# Patient Record
Sex: Male | Born: 1990 | Race: White | Hispanic: No | Marital: Married | Smoking: Former smoker
Health system: Southern US, Community
[De-identification: ages and names within clinical notes are randomized; demographics above are authoritative.]

---
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
## 2009-06-10 ENCOUNTER — Emergency Department: Payer: Self-pay | Admitting: Emergency Medicine

## 2010-11-27 IMAGING — CT CT ABD-PELV W/O CM
1 of 2 series · 15 of 32 positions shown, 19 images · non-contrast
Comparison: none

REASON FOR EXAM: (1) Periumbilical pain and vomiting; (2) Periumbilical
pain and vomiting
COMMENTS:

[Series 2: 3mm soft tissue · axial · 0.60mm/px · z∈[-489,-75]mm · 15 of 152 slices shown, 19 images]
[im 7/152  soft-tissue]
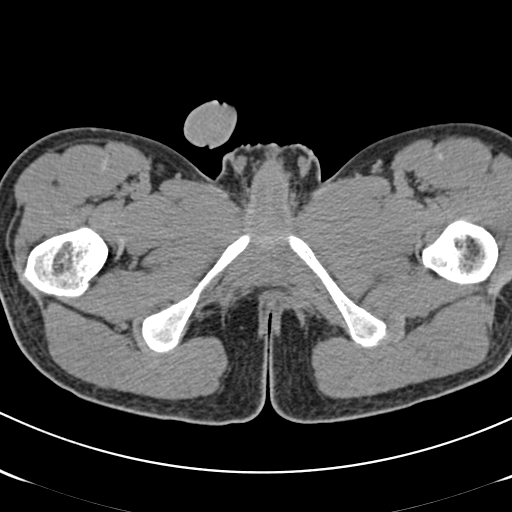
[im 7/152  bone]
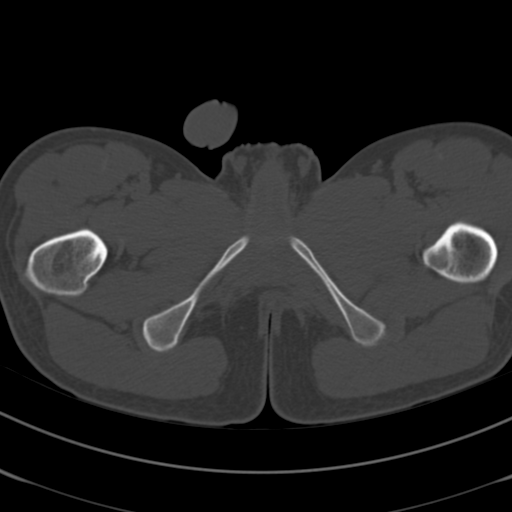
[im 19/152  soft-tissue]
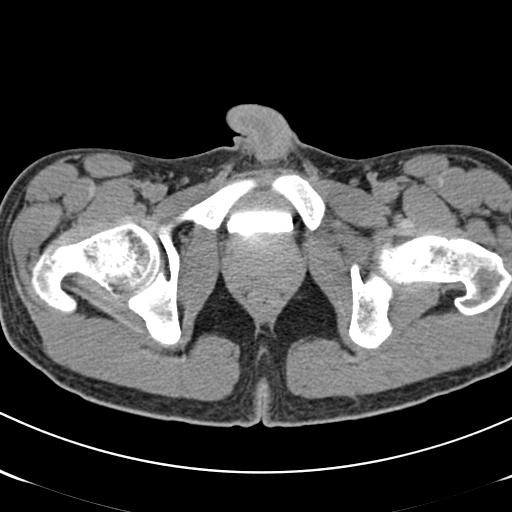
[im 32/152  soft-tissue]
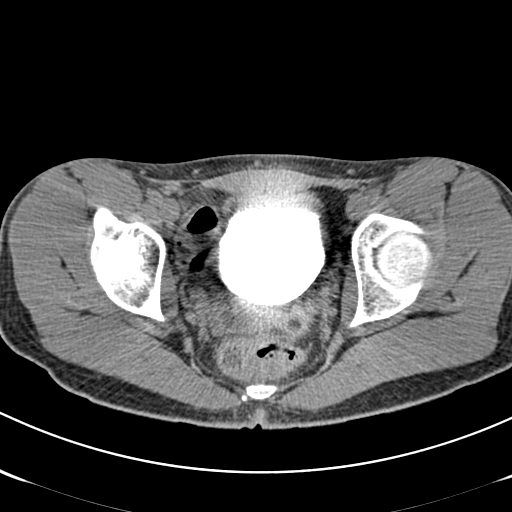
[im 45/152  soft-tissue]
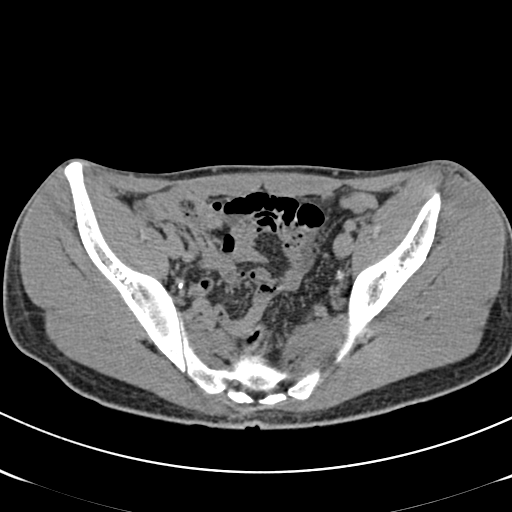
[im 51/152  soft-tissue]
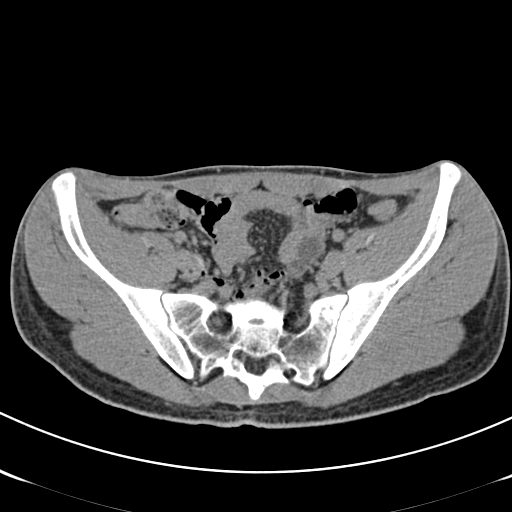
[im 63/152  soft-tissue]
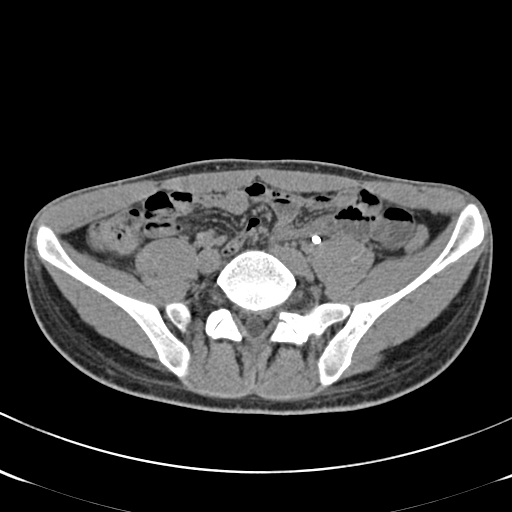
[im 76/152  soft-tissue]
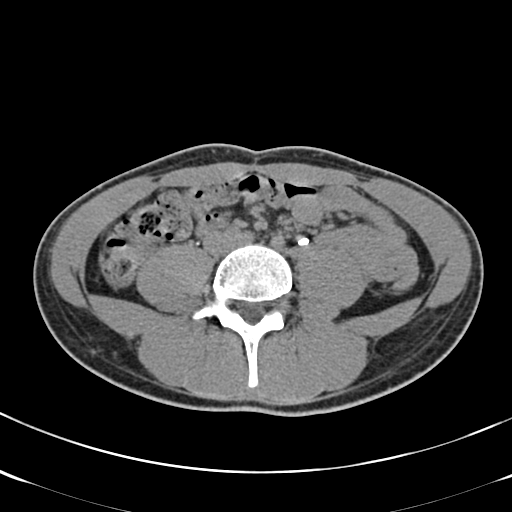
[im 89/152  soft-tissue]
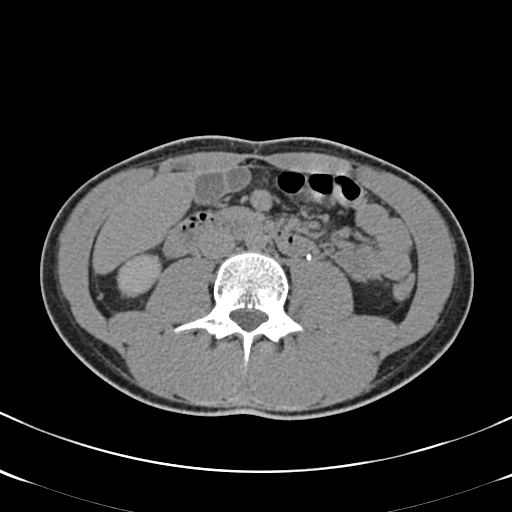
[im 101/152  soft-tissue]
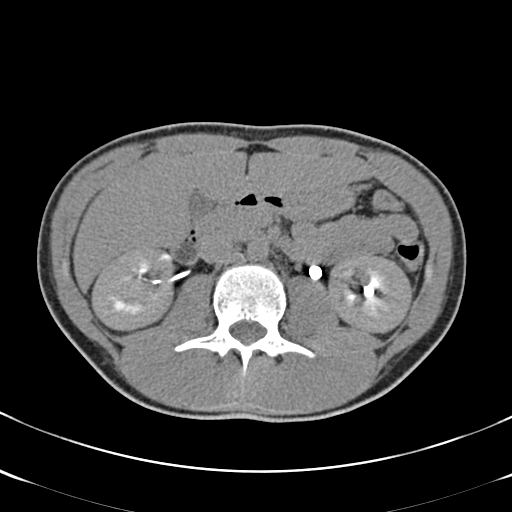
[im 101/152  bone]
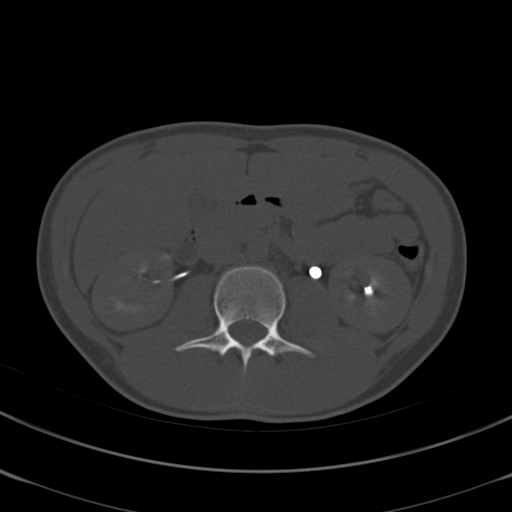
[im 107/152  soft-tissue]
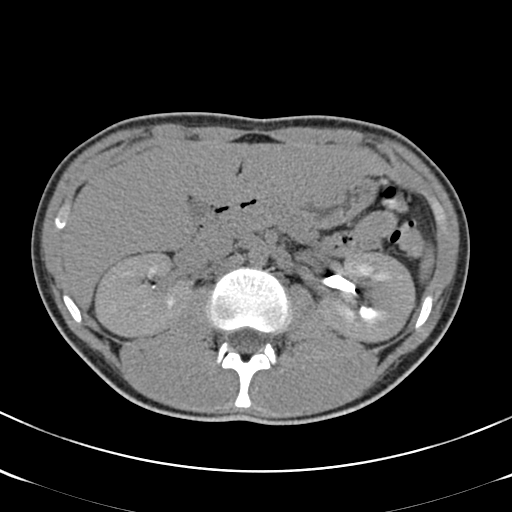
[im 120/152  soft-tissue]
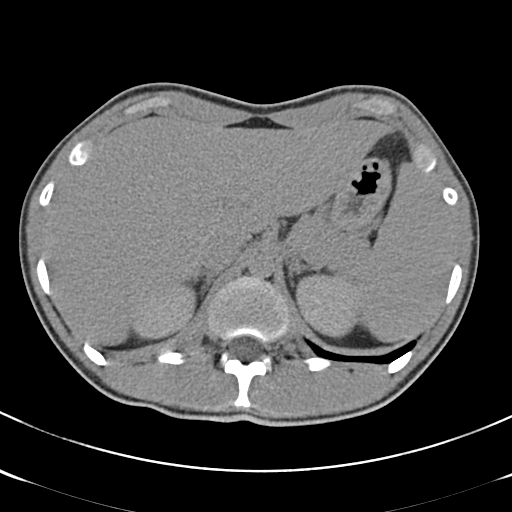
[im 126/152  lung]
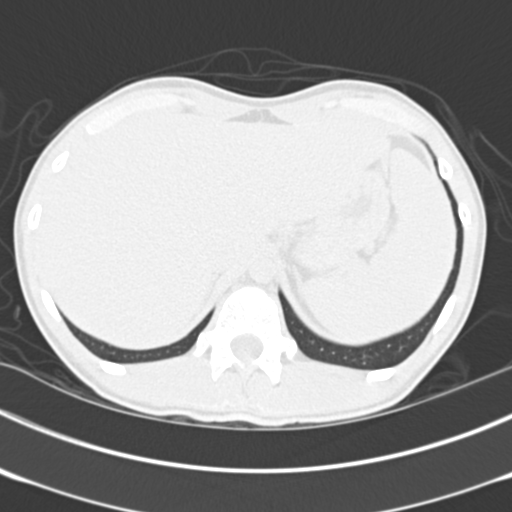
[im 133/152  soft-tissue]
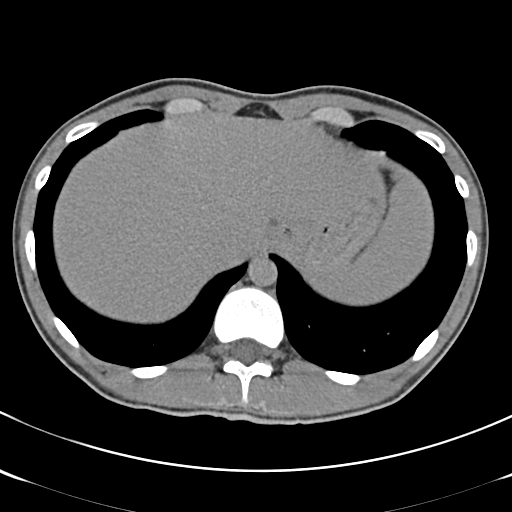
[im 133/152  lung]
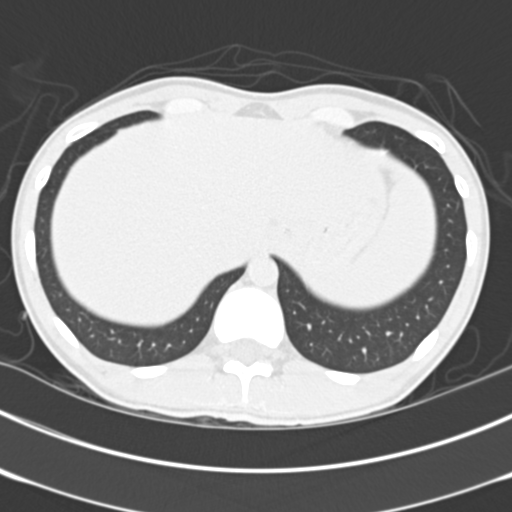
[im 139/152  lung]
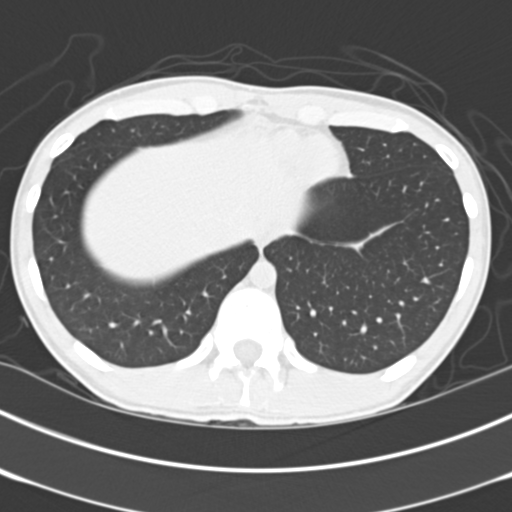
[im 145/152  soft-tissue]
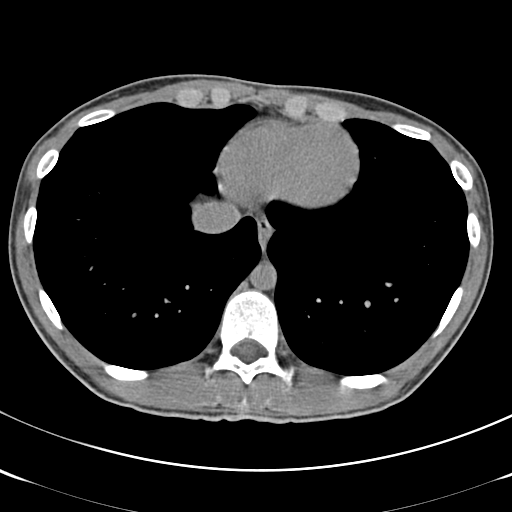
[im 145/152  lung]
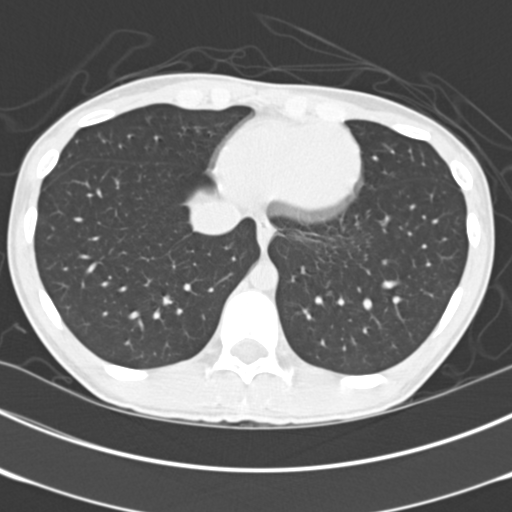

[15 of 32 positions shown; findings below may reference images not displayed]

PROCEDURE:     CT  - CT ABDOMEN AND PELVIS W[DATE]  [DATE]

RESULT:     Axial noncontrast CT scanning was performed through the abdomen
and pelvis at 3 mm intervals and slice thicknesses. Review of multiplanar
reconstructed images was performed separately on the VIA monitor. The study
was performed approximately 45 minutes after a neck CT that had been
performed with contrast.

The liver exhibits no focal mass or ductal dilation. The spleen is
borderline enlarged at 12 cm AP. The gallbladder, pancreas, adrenal glands,
and kidneys appear normal. There is fractional visualization of the ureters.
The partially distended urinary bladder is normal in appearance. The
unopacified loops of small and large bowel exhibit no acute abnormality. The
caliber of the abdominal aorta is normal. There is no evidence of ascites.
The lumbar vertebral bodies are preserved in height. The lung bases are
clear.
IMPRESSION: 1. There is borderline splenomegaly.
2. I do not see bulky intra-abdominal or pelvic lymphadenopathy.
3. I see no acute hepatobiliary abnormality nor acute bowel abnormality nor
acute urinary tract abnormality.

A preliminary report was sent to the [HOSPITAL] the conclusion
of the study.

## 2016-11-10 ENCOUNTER — Ambulatory Visit (INDEPENDENT_AMBULATORY_CARE_PROVIDER_SITE_OTHER): Payer: BLUE CROSS/BLUE SHIELD

## 2016-11-10 ENCOUNTER — Ambulatory Visit
Admission: EM | Admit: 2016-11-10 | Discharge: 2016-11-10 | Disposition: A | Discharge status: Home / Self Care | Payer: BLUE CROSS/BLUE SHIELD | Attending: Emergency Medicine | Admitting: Emergency Medicine

## 2016-11-10 ENCOUNTER — Other Ambulatory Visit: Payer: Self-pay

## 2016-11-10 DIAGNOSIS — R0789 Other chest pain: Secondary | ICD-10-CM | POA: Diagnosis not present

## 2016-11-10 DIAGNOSIS — R0602 Shortness of breath: Secondary | ICD-10-CM | POA: Diagnosis not present

## 2016-11-10 DIAGNOSIS — R059 Cough, unspecified: Secondary | ICD-10-CM | POA: Diagnosis present

## 2016-11-10 DIAGNOSIS — R05 Cough: Secondary | ICD-10-CM | POA: Diagnosis not present

## 2016-11-10 DIAGNOSIS — J181 Lobar pneumonia, unspecified organism: Secondary | ICD-10-CM | POA: Diagnosis not present

## 2016-11-10 DIAGNOSIS — R030 Elevated blood-pressure reading, without diagnosis of hypertension: Secondary | ICD-10-CM

## 2016-11-10 DIAGNOSIS — J189 Pneumonia, unspecified organism: Secondary | ICD-10-CM

## 2016-11-10 MED ORDER — BENZONATATE 100 MG PO CAPS: 100.0000 mg | ORAL_CAPSULE | Freq: Three times a day (TID) | ORAL | 0 refills | Status: AC

## 2016-11-10 MED ORDER — IBUPROFEN 800 MG PO TABS
800.0000 mg | ORAL_TABLET | Freq: Once | ORAL | Status: AC
  Administered 2016-11-10: 800 mg via ORAL

## 2016-11-10 MED ORDER — ALBUTEROL SULFATE HFA 108 (90 BASE) MCG/ACT IN AERS: 2.0000 | INHALATION_SPRAY | RESPIRATORY_TRACT | 0 refills | Status: AC | PRN

## 2016-11-10 MED ORDER — AZITHROMYCIN 250 MG PO TABS
250.0000 mg | ORAL_TABLET | Freq: Every day | ORAL | 0 refills | Status: AC
Start: 2016-11-10 — End: ?

## 2016-11-10 NOTE — ED Triage Notes (Signed)
Patient complains of cough and congestion x 2 weeks. Patient states that he has been taking mucinex for 2 weeks. Patient states that he is having left rib pain that started about 12am this morning after a coughing episode.

## 2016-11-10 NOTE — Discharge Instructions (Addendum)
Your chest xray shows you have early pneumonia, do not wear rib belt. Rest, push fluids, take meds as directed. May alternate tylenol/ibuprofen as label directed. Go to Er for new or worsening s/s(SOB, worsening pain, unable to keep meds down etc). Your Bp was elevated in office, please get it rechecked in 2-3 days. Return to UC as needed.

## 2016-11-10 NOTE — ED Provider Notes (Signed)
[facility]    CSN: [id number] Arrival date & time: 11/10/16  1903     History   Chief Complaint Chief Complaint  Patient presents with  . Cough    HPI James Garrett is a 26 y.o. male.   The history is provided by the patient. No language interpreter was used.  Cough  Cough characteristics:  Non-productive Severity:  Mild Onset quality:  Gradual Duration:  2 weeks Timing:  Constant Progression:  Unchanged Chronicity:  New Smoker: no   Context: upper respiratory infection   Relieved by:  Nothing Worsened by:  Deep breathing and activity Ineffective treatments:  Rest (back brace) Associated symptoms: myalgias and sinus congestion   Associated symptoms: no chest pain, no chills, no diaphoresis, no ear fullness, no ear pain, no eye discharge, no fever, no headaches, no rash, no rhinorrhea, no shortness of breath, no sore throat, no weight loss and no wheezing   Associated symptoms comment:  Left chest wall pain after coughing recently   History reviewed. No pertinent past medical history.  Patient Active Problem List   Diagnosis Date Noted  . Left-sided chest wall pain 11/10/2016  . Cough 11/10/2016  . Pneumonia of both lower lobes due to infectious organism (HCC) 11/10/2016    History reviewed. No pertinent surgical history.     Home Medications    Prior to Admission medications   Medication Sig Start Date End Date Taking? Authorizing Provider  albuterol (PROVENTIL HFA;VENTOLIN HFA) 108 (90 Base) MCG/ACT inhaler Inhale 2 puffs every 4 (four) hours as needed into the lungs for wheezing or shortness of breath. 11/10/16   Jimmey Hengel, Para MarchJeanette, NP  azithromycin (ZITHROMAX) 250 MG tablet Take 1 tablet (250 mg total) daily by mouth. Take first 2 tablets together, then 1 every day until finished. 11/10/16   Horrace Hanak, Para MarchJeanette, NP  benzonatate (TESSALON) 100 MG capsule Take 1 capsule (100 mg total) every 8 (eight) hours by mouth. 11/10/16   Zhyon Antenucci,  Para MarchJeanette, NP    Family History History reviewed. No pertinent family history.  Social History Social History   Tobacco Use  . Smoking status: Former Games developermoker  . Smokeless tobacco: Never Used  Substance Use Topics  . Alcohol use: Yes    Comment: occasionally  . Drug use: No     Allergies   Patient has no known allergies.   Review of Systems Review of Systems  Constitutional: Negative for chills, diaphoresis, fever and weight loss.  HENT: Positive for congestion. Negative for ear pain, rhinorrhea and sore throat.   Eyes: Negative for pain, discharge and visual disturbance.  Respiratory: Positive for cough. Negative for shortness of breath and wheezing.   Cardiovascular: Negative for chest pain and palpitations.  Gastrointestinal: Negative for abdominal pain and vomiting.  Genitourinary: Negative for dysuria and hematuria.  Musculoskeletal: Positive for myalgias. Negative for arthralgias and back pain.  Skin: Negative for color change and rash.  Neurological: Negative for seizures, syncope and headaches.  All other systems reviewed and are negative.    Physical Exam Triage Vital Signs ED Triage Vitals  Enc Vitals Group     BP 11/10/16 1916 (!) 133/96     Pulse Rate 11/10/16 1916 77     Resp 11/10/16 1916 18     Temp 11/10/16 1916 98.2 F (36.8 C)     Temp Source 11/10/16 1916 Oral     SpO2 11/10/16 1916 100 %     Weight 11/10/16 1914 150 lb (68 kg)  Height 11/10/16 1914 5\' 10"  (1.778 m)     Head Circumference --      Peak Flow --      Pain Score 11/10/16 1916 8     Pain Loc --      Pain Edu? --      Excl. in GC? --    No data found.  Updated Vital Signs BP (!) 133/96 (BP Location: Right Arm)   Pulse 77   Temp 98.2 F (36.8 C) (Oral)   Resp 18   Ht 5\' 10"  (1.778 m)   Wt 150 lb (68 kg)   SpO2 100%   BMI 21.52 kg/m    Physical Exam  Constitutional: He is oriented to person, place, and time. He appears well-developed and well-nourished. He is  active and cooperative.  Non-toxic appearance. He does not have a sickly appearance. He does not appear ill. No distress.  HENT:  Head: Normocephalic.  Right Ear: Tympanic membrane is retracted.  Left Ear: Tympanic membrane is retracted.  Nose: Mucosal edema present.  Mouth/Throat: Uvula is midline, oropharynx is clear and moist and mucous membranes are normal.  Eyes: Conjunctivae, EOM and lids are normal. Pupils are equal, round, and reactive to light.  Neck: Normal range of motion.  Cardiovascular: Normal rate, regular rhythm and normal heart sounds.  Pulmonary/Chest: Effort normal and breath sounds normal. No respiratory distress. He has no decreased breath sounds. He has no wheezes.  Abdominal: Soft. He exhibits no distension.  Musculoskeletal: Normal range of motion.  Neurological: He is alert and oriented to person, place, and time. No cranial nerve deficit or sensory deficit. GCS eye subscore is 4. GCS verbal subscore is 5. GCS motor subscore is 6.  Skin: Skin is warm, dry and intact. No rash noted.  Psychiatric: He has a normal mood and affect. His speech is normal and behavior is normal.  Nursing note and vitals reviewed.    UC Treatments / Results  Labs (all labs ordered are listed, but only abnormal results are displayed) Labs Reviewed - No data to display  EKG  EKG Interpretation None       Radiology Dg Chest 2 View  Result Date: 11/10/2016 CLINICAL DATA:  Productive cough and shortness of breath for 2 weeks. EXAM: CHEST  2 VIEW COMPARISON:  None. FINDINGS: The cardiac silhouette, mediastinal and hilar contours are normal. The lungs demonstrate normal aeration. Patchy E nodular basilar opacities could be small infiltrates. No pleural effusion or pulmonary edema. The bony thorax is normal. IMPRESSION: Small patchy nodular opacities at both lung bases, possible small infiltrates. Electronically Signed   By: Rudie MeyerP.  Gallerani M.D.   On: 11/10/2016 19:48     Procedures Procedures (including critical care time)  Medications Ordered in UC Medications  ibuprofen (ADVIL,MOTRIN) tablet 800 mg (800 mg Oral Given 11/10/16 1923)     Initial Impression / Assessment and Plan / UC Course  I have reviewed the triage vital signs and the nursing notes.  Pertinent labs & imaging results that were available during my care of the patient were reviewed by me and considered in my medical decision making (see chart for details).     Pt received ibuprofen 800 mg po in office, CXR pending. Awaiting results.    2006: Pt improved reviewed results and plan of care. Your chest xray shows you have early pneumonia, do not wear rib belt. Rest, push fluids, take meds as directed. May alternate tylenol/ibuprofen as label directed. Go to Er for new or  worsening s/s(SOB, worsening pain, unable to keep meds down etc). Your BP was elevated in office, please get it rechecked in 2-3 days. Return to UC as needed. Pt verbalized understanding to this provider.  Final Clinical Impressions(s) / UC Diagnoses   Final diagnoses:  Left-sided chest wall pain  Cough  Pneumonia of both lower lobes due to infectious organism [facility])  Elevated blood pressure reading    ED Discharge Orders        Ordered    azithromycin (ZITHROMAX) 250 MG tablet  Daily     11/10/16 2002    benzonatate (TESSALON) 100 MG capsule  Every 8 hours     11/10/16 2002    albuterol (PROVENTIL HFA;VENTOLIN HFA) 108 (90 Base) MCG/ACT inhaler  Every 4 hours PRN     11/10/16 2002       Controlled Substance Prescriptions    Jeena Arnett, Para March, NP 11/10/16 2023

## 2018-04-28 IMAGING — CR DG CHEST 2V
2 series · 3 of 3 positions shown · non-contrast
Comparison: None.

CLINICAL DATA: Productive cough and shortness of breath for 2
weeks.

EXAM:
CHEST  2 VIEW

[Series 1: chest pa · 0.14mm/px · 2 of 2 slices shown]
[im 1/2]
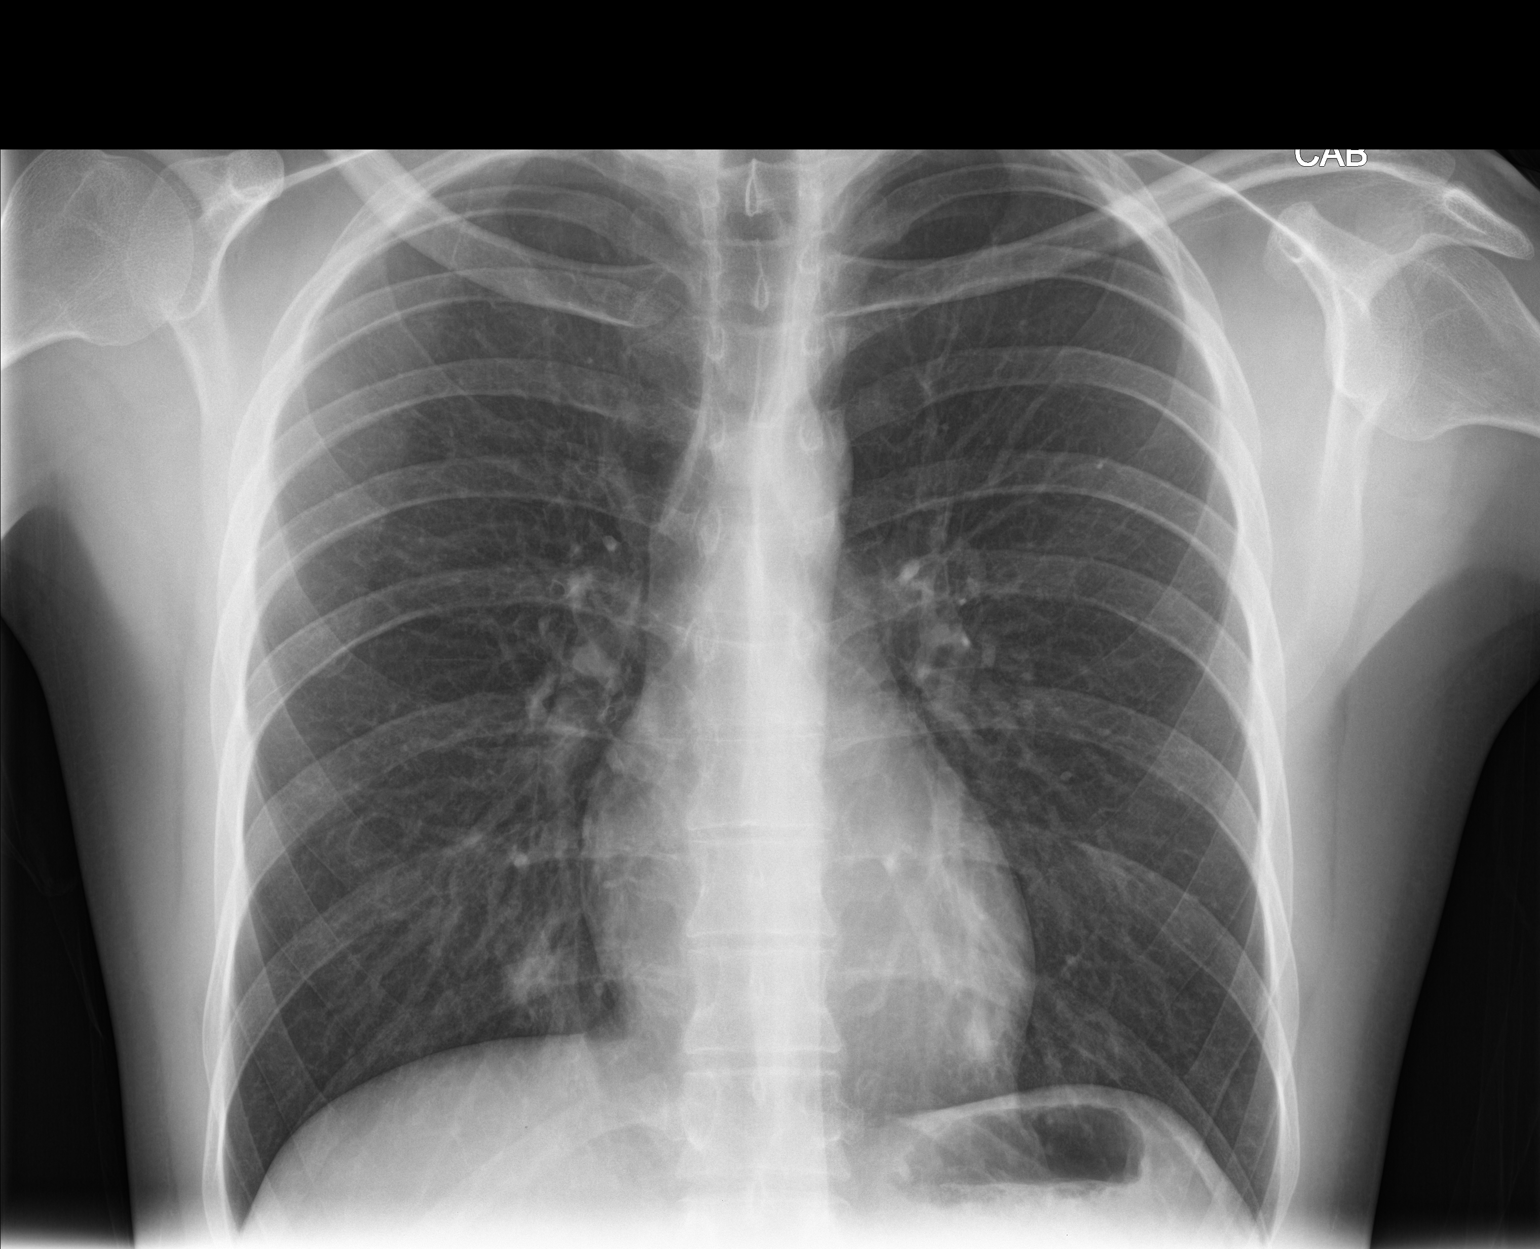
[im 2/2]
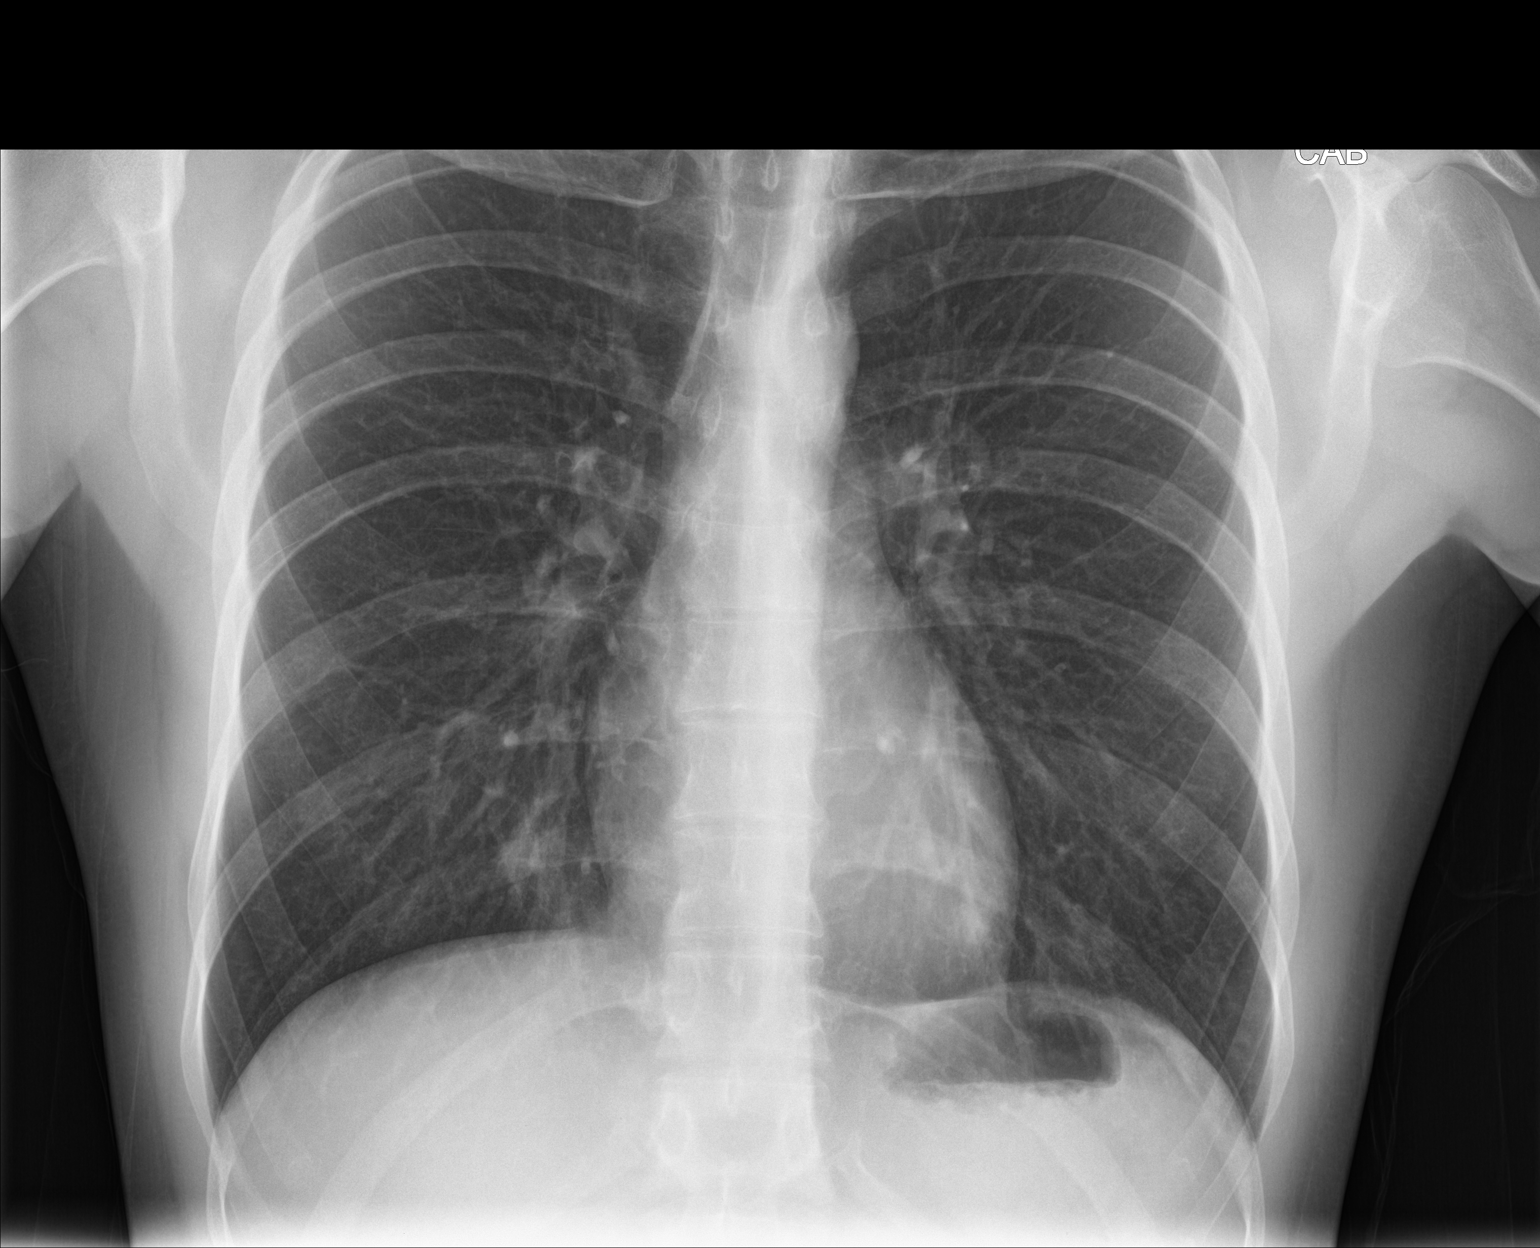

[chest lat]
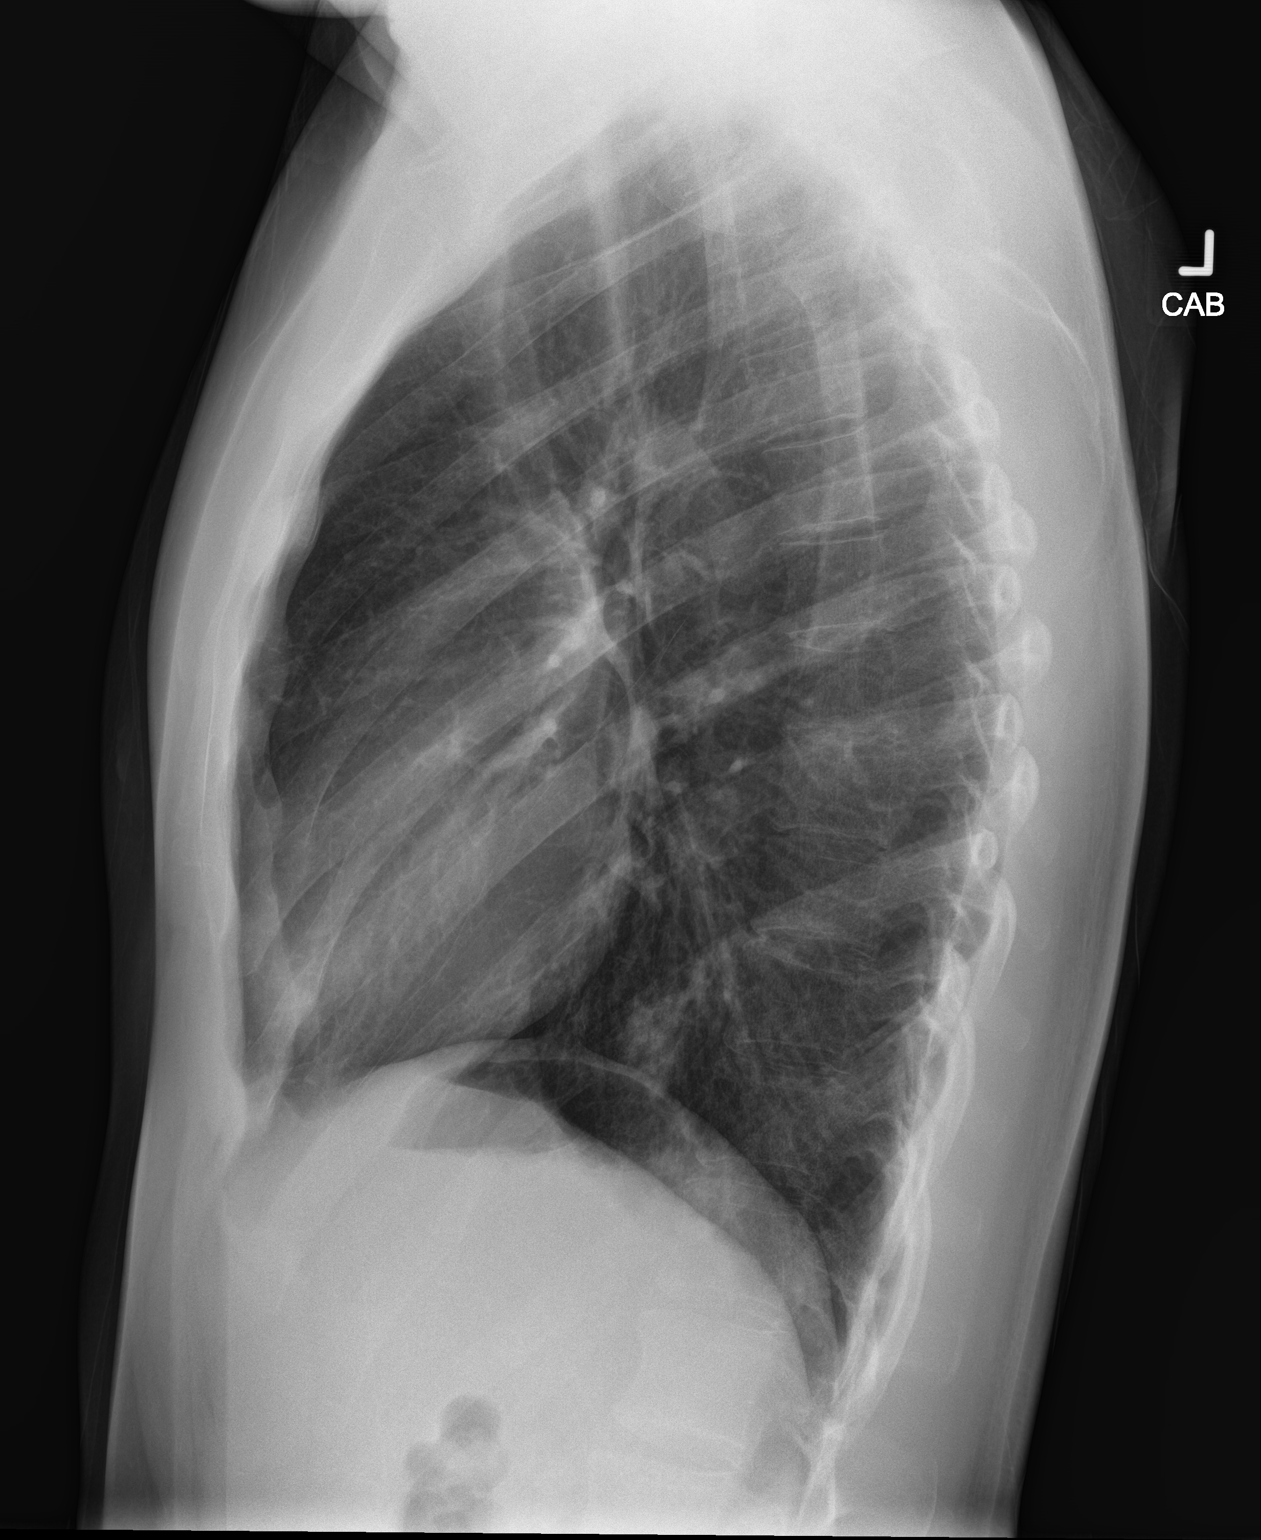

[3 of 3 positions shown; findings below may reference images not displayed]

FINDINGS: The cardiac silhouette, mediastinal and hilar contours are normal.
The lungs demonstrate normal aeration. Patchy E nodular basilar
opacities could be small infiltrates. No pleural effusion or
pulmonary edema. The bony thorax is normal.
IMPRESSION: Small patchy nodular opacities at both lung bases, possible small
infiltrates.

## 2021-11-21 ENCOUNTER — Emergency Department
Admission: EM | Admit: 2021-11-21 | Discharge: 2021-11-21 | Disposition: A | Discharge status: Home / Self Care | Payer: 59 | Attending: Emergency Medicine | Admitting: Emergency Medicine

## 2021-11-21 DIAGNOSIS — T7840XA Allergy, unspecified, initial encounter: Secondary | ICD-10-CM | POA: Diagnosis not present

## 2021-11-21 MED ORDER — PREDNISONE 20 MG PO TABS
60.0000 mg | ORAL_TABLET | Freq: Once | ORAL | Status: AC
  Administered 2021-11-21: 60 mg via ORAL
  Filled 2021-11-21: qty 3

## 2021-11-21 MED ORDER — FAMOTIDINE 20 MG PO TABS
40.0000 mg | ORAL_TABLET | Freq: Once | ORAL | Status: AC
  Administered 2021-11-21: 40 mg via ORAL
  Filled 2021-11-21: qty 2

## 2021-11-21 MED ORDER — DIPHENHYDRAMINE HCL 50 MG/ML IJ SOLN
50.0000 mg | Freq: Once | INTRAMUSCULAR | Status: AC
  Administered 2021-11-21: 50 mg via INTRAVENOUS
  Filled 2021-11-21: qty 1

## 2021-11-21 MED ORDER — FAMOTIDINE IN NACL 20-0.9 MG/50ML-% IV SOLN
20.0000 mg | Freq: Once | INTRAVENOUS | Status: AC
  Administered 2021-11-21: 20 mg via INTRAVENOUS
  Filled 2021-11-21: qty 50

## 2021-11-21 MED ORDER — METHYLPREDNISOLONE SODIUM SUCC 125 MG IJ SOLR
125.0000 mg | Freq: Once | INTRAMUSCULAR | Status: AC
  Administered 2021-11-21: 125 mg via INTRAVENOUS
  Filled 2021-11-21: qty 2

## 2021-11-21 NOTE — Discharge Instructions (Signed)
Please take 50 mg of Benadryl 4 times a day today.  We have given you a dose of prednisone and Pepcid here.  That should carry you through the rest of the day.  Please return here if you have any increasing hives.  If you get any swelling in your mouth call [phone] again.

## 2021-11-21 NOTE — ED Triage Notes (Signed)
Pt c/o hives on torso & bilateral arms, swelling in arms and tongue and diarrhea. 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 Pt took Benadryl 50 mg & used epi pen at home.

## 2021-11-21 NOTE — ED Provider Notes (Signed)
[facility] Provider Note    Event Date/Time   First MD Initiated Contact with Patient 11/21/21 [phone]     (approximate)   History   Allergic Reaction   HPI  James Garrett is a 31 y.o. male who reports he woke up about an hour and a half ago itching all over her head and had some swelling in his mouth.  He is not sure why.  He has not had no new detergents or exposures to anything else.  He does have an allergic reaction with onions but did not think he ate any.  EMS is given him epi IM and the swelling in his mouth is gone his itching is somewhat better although he still having some itching and some hives   Patient denies any medical problems does not take any medicines and does not have any allergies that he knows about besides the onions.  Physical Exam   Triage Vital Signs: ED Triage Vitals  Enc Vitals Group     BP 11/21/21 0215 (!) 143/89     Pulse Rate 11/21/21 0215 71     Resp 11/21/21 0215 17     Temp 11/21/21 0215 97.8 F (36.6 C)     Temp Source 11/21/21 0215 Oral     SpO2 11/21/21 0215 96 %     Weight 11/21/21 0216 180 lb (81.6 kg)     Height 11/21/21 0216 5\' 10"  (1.778 m)     Head Circumference --      Peak Flow --      Pain Score 11/21/21 0216 0     Pain Loc --      Pain Edu? --      Excl. in GC? --     Most recent vital signs: Vitals:   11/21/21 0430 11/21/21 0500  BP: 131/85 130/85  Pulse: 69 65  Resp: 19 18  Temp:    SpO2: 95% 95%    General: Awake, no distress.  Neck no stridor CV:  Good peripheral perfusion.  Heart regular rate and rhythm no audible murmurs Resp:  Normal effort.  No wheezing Abd:  No distention.  No tenderness Extremities with no edema Skin with hives diffusely   ED Results / Procedures / Treatments   Labs (all labs ordered are listed, but only abnormal results are displayed) Labs Reviewed - No data to display   EKG  EKG read and interpreted by me shows normal sinus rhythm rate of 75  normal axis no acute ST-T changes   RADIOLOGY   PROCEDURES:  Critical Care performed:   Procedures   MEDICATIONS ORDERED IN ED: Medications  methylPREDNISolone sodium succinate (SOLU-MEDROL) 125 mg/2 mL injection 125 mg (125 mg Intravenous Given 11/21/21 0223)  diphenhydrAMINE (BENADRYL) injection 50 mg (50 mg Intravenous Given 11/21/21 0223)  famotidine (PEPCID) IVPB 20 mg premix (0 mg Intravenous Stopped 11/21/21 0253)  predniSONE (DELTASONE) tablet 60 mg (60 mg Oral Given 11/21/21 0513)  famotidine (PEPCID) tablet 40 mg (40 mg Oral Given 11/21/21 0513)     IMPRESSION / MDM / ASSESSMENT AND PLAN / ED COURSE  I reviewed the triage vital signs and the nursing notes. ----------------------------------------- 2:21 AM on 11/21/2021 ----------------------------------------- Will give the patient Benadryl famotidine and Solu-Medrol and watch him for a while. ----------------------------------------- 3:37 AM on 11/21/2021 ----------------------------------------- Patient doing well no further mouth swelling no further itching no further rash Differential diagnosis includes, but is not limited to, idiopathic hives, allergic reaction  Patient's presentation  is most consistent with acute presentation with potential threat to life or bodily function.  The patient is on the cardiac monitor to evaluate for evidence of arrhythmia and/or significant heart rate changes.  None have been seen  I considered admission for this gentleman but his symptoms had resolved and he remained stable.  He did not need admission.     FINAL CLINICAL IMPRESSION(S) / ED DIAGNOSES   Final diagnoses:  Allergic reaction, initial encounter     Rx / DC Orders   ED Discharge Orders     None        Note:  This document was prepared using Dragon voice recognition software and may include unintentional dictation errors.   Arnaldo Natal, MD 11/21/21 [phone]
